# Patient Record
Sex: Male | Born: 2000 | Race: Black or African American | Hispanic: Yes | Marital: Single | Smoking: Never smoker
Health system: Southern US, Community
[De-identification: ages and names within clinical notes are randomized; demographics above are authoritative.]

## PROBLEM LIST (undated history)

## (undated) ENCOUNTER — Emergency Department (HOSPITAL_COMMUNITY): Discharge status: Absent without leave | Payer: No Typology Code available for payment source

## (undated) DIAGNOSIS — F329 Major depressive disorder, single episode, unspecified: Secondary | ICD-10-CM

## (undated) DIAGNOSIS — F32A Depression, unspecified: Secondary | ICD-10-CM

## (undated) DIAGNOSIS — S7290XA Unspecified fracture of unspecified femur, initial encounter for closed fracture: Secondary | ICD-10-CM

## (undated) DIAGNOSIS — F909 Attention-deficit hyperactivity disorder, unspecified type: Secondary | ICD-10-CM

## (undated) DIAGNOSIS — F419 Anxiety disorder, unspecified: Secondary | ICD-10-CM

## (undated) HISTORY — PX: WRIST SURGERY: SHX841

## (undated) HISTORY — PX: FEMUR CLOSED REDUCTION: SHX939

---
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
## 2019-06-16 ENCOUNTER — Other Ambulatory Visit: Payer: Self-pay

## 2019-06-16 ENCOUNTER — Ambulatory Visit (HOSPITAL_COMMUNITY)
Admission: EM | Admit: 2019-06-16 | Discharge: 2019-06-16 | Disposition: A | Discharge status: Home / Self Care | Payer: Medicaid Other | Attending: Internal Medicine | Admitting: Internal Medicine

## 2019-06-16 ENCOUNTER — Encounter (HOSPITAL_COMMUNITY): Payer: Self-pay

## 2019-06-16 DIAGNOSIS — Z202 Contact with and (suspected) exposure to infections with a predominantly sexual mode of transmission: Secondary | ICD-10-CM | POA: Diagnosis present

## 2019-06-16 DIAGNOSIS — Z113 Encounter for screening for infections with a predominantly sexual mode of transmission: Secondary | ICD-10-CM | POA: Diagnosis not present

## 2019-06-16 MED ORDER — DOXYCYCLINE HYCLATE 100 MG PO CAPS: 100.0000 mg | ORAL_CAPSULE | Freq: Two times a day (BID) | ORAL | 0 refills | Status: AC
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
## 2019-06-16 NOTE — ED Triage Notes (Signed)
Pt states he was told to come get check for STD. Pt states he has been burning after he voids.

## 2019-06-20 ENCOUNTER — Telehealth (HOSPITAL_COMMUNITY): Payer: Self-pay | Admitting: Emergency Medicine

## 2019-06-20 LAB — CYTOLOGY, (ORAL, ANAL, URETHRAL) ANCILLARY ONLY
Chlamydia: POSITIVE — AB
Neisseria Gonorrhea: NEGATIVE
Trichomonas: NEGATIVE

## 2019-06-20 NOTE — Telephone Encounter (Signed)
Chlamydia is positive.  This was treated at the urgent care visit with doxy script. Please refrain from sexual intercourse for 7 days to give the medicine time to work.  Sexual partners need to be notified and tested/treated.  Condoms may reduce risk of reinfection.  Recheck or followup with PCP for further evaluation if symptoms are not improving.  [facility] notified.  Attempted to reach patient. No answer at this time. Both numbers not working

## 2019-06-21 ENCOUNTER — Telehealth: Payer: Self-pay | Admitting: Emergency Medicine

## 2019-06-21 NOTE — Telephone Encounter (Signed)
Attempted to reach patient x2. No answer at this time. Voicemail left.   Both numbers just rang continuously

## 2019-06-22 ENCOUNTER — Telehealth (HOSPITAL_COMMUNITY): Payer: Self-pay | Admitting: Emergency Medicine

## 2019-06-22 NOTE — Telephone Encounter (Signed)
Attempted to reach patient x3. No answer at this time. Letter sent.

## 2019-06-28 ENCOUNTER — Telehealth (HOSPITAL_COMMUNITY): Payer: Self-pay | Admitting: Emergency Medicine

## 2019-06-28 NOTE — Telephone Encounter (Signed)
Pt contacted regarding chlamydia results. All questions answered

## 2022-06-05 ENCOUNTER — Ambulatory Visit (HOSPITAL_COMMUNITY)
Admission: RE | Admit: 2022-06-05 | Discharge: 2022-06-05 | Disposition: A | Discharge status: Home / Self Care | Payer: Medicaid Other | Source: Ambulatory Visit | Attending: Emergency Medicine | Admitting: Emergency Medicine

## 2022-06-05 ENCOUNTER — Encounter (HOSPITAL_COMMUNITY): Payer: Self-pay

## 2022-06-05 VITALS — BP 109/69 | HR 61 | Temp 98.1°F | Resp 16

## 2022-06-05 DIAGNOSIS — Z202 Contact with and (suspected) exposure to infections with a predominantly sexual mode of transmission: Secondary | ICD-10-CM | POA: Diagnosis not present

## 2022-06-05 DIAGNOSIS — Z113 Encounter for screening for infections with a predominantly sexual mode of transmission: Secondary | ICD-10-CM | POA: Diagnosis present

## 2022-06-05 HISTORY — DX: Unspecified fracture of unspecified femur, initial encounter for closed fracture: S72.90XA

## 2022-06-05 LAB — POCT URINALYSIS DIPSTICK, ED / UC
Bilirubin Urine: NEGATIVE
Glucose, UA: NEGATIVE mg/dL
Hgb urine dipstick: NEGATIVE
Ketones, ur: NEGATIVE mg/dL
Leukocytes,Ua: NEGATIVE
Nitrite: NEGATIVE
Protein, ur: NEGATIVE mg/dL
Specific Gravity, Urine: 1.02 (ref 1.005–1.030)
Urobilinogen, UA: 0.2 mg/dL (ref 0.0–1.0)
pH: 7 (ref 5.0–8.0)

## 2022-06-05 NOTE — Discharge Instructions (Addendum)
We will call you if anything on your swab returns positive. Please abstain from sexual intercourse until your results return. 

## 2022-06-05 NOTE — ED Provider Notes (Signed)
[facility]    CSN: [id number] Arrival date & time: 06/05/22  1257     History   Chief Complaint Chief Complaint  Patient presents with   Penile Problem   Appt    1300    HPI Noah Collier is a 21 y.o. male.  Presents for STD testing Reports "clear mucous" at the urethra occasionally over the last several days Inconsistently uses barrier protection. Denies known exposures  Denies penile discharge, dysuria, pubic rash, sores or ulcerations, swelling of meatus or testicles  He is concerned for UTI, reports history of this when in jail. No urinary symptoms.   Treated for chlamydia 2 years ago but had no symptoms at that time  Past Medical History:  Diagnosis Date   ADHD (attention deficit hyperactivity disorder)    Anxiety    Depression    Femur fracture [facility])     Patient Active Problem List   Diagnosis Date Noted   Femur fracture, left (HCC) 07/22/2016   Mood disorder (HCC) 09/07/2013   Cannabis abuse 09/07/2013   ADHD (attention deficit hyperactivity disorder) 09/07/2013   Aggression 09/07/2013    Past Surgical History:  Procedure Laterality Date   FEMUR CLOSED REDUCTION     FEMUR IM NAIL Left 07/21/2016   Procedure: INTRAMEDULLARY (IM) NAIL FEMORAL;  Surgeon: Venita Lick, MD;  Location: [facility] OR;  Service: Orthopedics;  Laterality: Left;   WRIST SURGERY       Home Medications    Prior to Admission medications   Medication Sig Start Date End Date Taking? Authorizing Provider  Omeprazole Magnesium (PRILOSEC PO) Take by mouth.   Yes [provider]  amphetamine-dextroamphetamine (ADDERALL XR) 10 MG 24 hr capsule Take 1 capsule (10 mg total) by mouth 2 (two) times daily. With breakfast and dinner 09/13/13   Trinda Pascal B, NP  Cholecalciferol (VITAMIN D) 2000 UNITS CAPS Take 1 capsule (2,000 Units total) by mouth daily. Patient may resume home supply.  Patient/family to follow directions for administration as noted on the bottle. 09/13/13    Winson, Louie Bun, NP  enoxaparin (LOVENOX) 40 MG/0.4ML injection Inject 0.4 mLs (40 mg total) into the skin daily. 10 day supply 1 injection per day 07/24/16   Venita Lick, MD  FLUoxetine (PROZAC) 20 MG capsule Take 1 capsule (20 mg total) by mouth daily. 09/13/13   Winson, Louie Bun, NP  HYDROcodone-acetaminophen (NORCO) 10-325 MG tablet Take 1 tablet by mouth every 6 (six) hours as needed. 07/24/16   Venita Lick, MD  HYDROcodone-acetaminophen (NORCO/VICODIN) 5-325 MG per tablet Take 1-2 tablets every 6 hours as needed for severe pain 01/21/15   Renne Crigler, PA-C  ibuprofen (ADVIL,MOTRIN) 600 MG tablet Take 1 tablet (600 mg total) by mouth every 8 (eight) hours as needed for moderate pain. 07/24/16   Venita Lick, MD  methocarbamol (ROBAXIN) 500 MG tablet Take 1 tablet (500 mg total) by mouth 3 (three) times daily as needed for muscle spasms. 07/24/16   Venita Lick, MD  naproxen (NAPROSYN) 500 MG tablet Take 1 tablet (500 mg total) by mouth 2 (two) times daily. 01/21/15   Renne Crigler, PA-C  ondansetron (ZOFRAN) 4 MG tablet Take 1 tablet (4 mg total) by mouth every 8 (eight) hours as needed for nausea or vomiting. 07/24/16   Venita Lick, MD    Family History Family History  Problem Relation Age of Onset   Gallbladder disease Mother    Diabetes Mother    Hypertension Mother    Mental illness  Mother    Heart disease Maternal Grandmother    Kidney disease Maternal Grandmother    Mental illness Maternal Grandmother     Social History Social History   Tobacco Use   Smoking status: Never   Smokeless tobacco: Never  Vaping Use   Vaping Use: Never used  Substance Use Topics   Alcohol use: Yes    Comment: occasionally   Drug use: Yes    Types: Marijuana     Allergies   Patient has no known allergies.   Review of Systems Review of Systems  As per HPI  Physical Exam Triage Vital Signs ED Triage Vitals  Enc Vitals Group     BP      Pulse      Resp      Temp      Temp src       SpO2      Weight      Height      Head Circumference      Peak Flow      Pain Score      Pain Loc      Pain Edu?      Excl. in GC?    No data found.  Updated Vital Signs BP 109/69   Pulse 61   Temp 98.1 F (36.7 C) (Oral)   Resp 16   SpO2 97%    Physical Exam Vitals and nursing note reviewed. Exam conducted with a chaperone present Harlon Flor).  HENT:     Mouth/Throat:     Pharynx: Oropharynx is clear.  Cardiovascular:     Rate and Rhythm: Normal rate and regular rhythm.     Pulses: Normal pulses.  Pulmonary:     Effort: Pulmonary effort is normal.  Genitourinary:    Penis: Normal and uncircumcised. No erythema, discharge, swelling or lesions.   Skin:    General: Skin is warm and dry.  Neurological:     Mental Status: He is alert and oriented to person, place, and time.      UC Treatments / Results  Labs (all labs ordered are listed, but only abnormal results are displayed) Labs Reviewed  POCT URINALYSIS DIPSTICK, ED / UC  CYTOLOGY, (ORAL, ANAL, URETHRAL) ANCILLARY ONLY    EKG  Radiology No results found.  Procedures Procedures (including critical care time)  Medications Ordered in UC Medications - No data to display  Initial Impression / Assessment and Plan / UC Course  I have reviewed the triage vital signs and the nursing notes.  Pertinent labs & imaging results that were available during my care of the patient were reviewed by me and considered in my medical decision making (see chart for details).  UA negative. No sign of infection or UTI. No abnormality on exam today. Cytoswab pending. Treat positive result as indicated. Return precautions discussed. Patient agrees to plan  Final Clinical Impressions(s) / UC Diagnoses   Final diagnoses:  Screen for STD (sexually transmitted disease)     Discharge Instructions      We will call you if anything on your swab returns positive. Please abstain from sexual intercourse until your  results return.     ED Prescriptions   None    I have reviewed the PDMP during this encounter.   Damaris Geers, Lurena Joiner, PA-C 06/05/22 1407

## 2022-06-05 NOTE — ED Triage Notes (Addendum)
Pt denies penile discharge or dysuria. Pt describes "stickiness" to external penis and is concerned for UTI, as he has had one in past with same sx.

## 2022-06-06 ENCOUNTER — Telehealth (HOSPITAL_COMMUNITY): Payer: Self-pay | Admitting: Emergency Medicine

## 2022-06-06 LAB — CYTOLOGY, (ORAL, ANAL, URETHRAL) ANCILLARY ONLY
Chlamydia: NEGATIVE
Comment: NEGATIVE
Comment: NEGATIVE
Comment: NORMAL
Neisseria Gonorrhea: NEGATIVE
Trichomonas: POSITIVE — AB

## 2022-06-06 MED ORDER — METRONIDAZOLE 500 MG PO TABS: 2000.0000 mg | ORAL_TABLET | Freq: Once | ORAL | 0 refills | Status: AC

## 2022-08-25 ENCOUNTER — Ambulatory Visit (HOSPITAL_COMMUNITY): Payer: Medicaid Other

## 2022-08-26 ENCOUNTER — Ambulatory Visit (HOSPITAL_COMMUNITY): Payer: Medicaid Other

## 2022-09-02 ENCOUNTER — Ambulatory Visit (HOSPITAL_COMMUNITY): Payer: Medicaid Other

## 2022-09-03 ENCOUNTER — Inpatient Hospital Stay (HOSPITAL_COMMUNITY): Admission: RE | Admit: 2022-09-03 | Payer: Self-pay | Source: Ambulatory Visit

## 2023-12-23 ENCOUNTER — Ambulatory Visit (HOSPITAL_COMMUNITY): Payer: Self-pay
